# Patient Record
Sex: Male | Born: 1965 | Race: White | Hispanic: No | Marital: Single | Smoking: Never smoker
Health system: Southern US, Community
[De-identification: ages and names within clinical notes are randomized; demographics above are authoritative.]

---

## 2018-07-15 ENCOUNTER — Other Ambulatory Visit: Payer: Self-pay

## 2018-07-15 ENCOUNTER — Emergency Department (HOSPITAL_COMMUNITY)
Admission: EM | Admit: 2018-07-15 | Discharge: 2018-07-15 | Disposition: A | Discharge status: Home / Self Care | Payer: 59 | Attending: Emergency Medicine | Admitting: Emergency Medicine

## 2018-07-15 ENCOUNTER — Emergency Department (HOSPITAL_COMMUNITY): Payer: 59

## 2018-07-15 ENCOUNTER — Encounter (HOSPITAL_COMMUNITY): Payer: Self-pay | Admitting: Emergency Medicine

## 2018-07-15 DIAGNOSIS — R079 Chest pain, unspecified: Secondary | ICD-10-CM

## 2018-07-15 DIAGNOSIS — Z7982 Long term (current) use of aspirin: Secondary | ICD-10-CM | POA: Diagnosis not present

## 2018-07-15 DIAGNOSIS — Z79899 Other long term (current) drug therapy: Secondary | ICD-10-CM | POA: Insufficient documentation

## 2018-07-15 LAB — CBG MONITORING, ED: Glucose-Capillary: 84 mg/dL (ref 70–99)

## 2018-07-15 LAB — CBC
HCT: 47 % (ref 39.0–52.0)
Hemoglobin: 15.3 g/dL (ref 13.0–17.0)
MCH: 29.5 pg (ref 26.0–34.0)
MCHC: 32.6 g/dL (ref 30.0–36.0)
MCV: 90.7 fL (ref 80.0–100.0)
Platelets: 269 10*3/uL (ref 150–400)
RBC: 5.18 MIL/uL (ref 4.22–5.81)
RDW: 12.4 % (ref 11.5–15.5)
WBC: 8.2 10*3/uL (ref 4.0–10.5)
nRBC: 0 % (ref 0.0–0.2)

## 2018-07-15 LAB — COMPREHENSIVE METABOLIC PANEL
ALT: 28 U/L (ref 0–44)
AST: 27 U/L (ref 15–41)
Albumin: 3.8 g/dL (ref 3.5–5.0)
Alkaline Phosphatase: 70 U/L (ref 38–126)
Anion gap: 14 (ref 5–15)
BUN: 10 mg/dL (ref 6–20)
CO2: 19 mmol/L — ABNORMAL LOW (ref 22–32)
Calcium: 9.6 mg/dL (ref 8.9–10.3)
Chloride: 103 mmol/L (ref 98–111)
Creatinine, Ser: 1.13 mg/dL (ref 0.61–1.24)
GFR calc Af Amer: >60 mL/min (ref >60)
GFR calc non Af Amer: >60 mL/min (ref >60)
Glucose, Bld: 128 mg/dL — ABNORMAL HIGH (ref 70–99)
Potassium: 3.9 mmol/L (ref 3.5–5.1)
Sodium: 136 mmol/L (ref 135–145)
Total Bilirubin: 0.7 mg/dL (ref 0.3–1.2)
Total Protein: 7.4 g/dL (ref 6.5–8.1)

## 2018-07-15 LAB — TROPONIN I

## 2018-07-15 MED ORDER — ASPIRIN 81 MG PO CHEW: 324.0000 mg | CHEWABLE_TABLET | Freq: Once | ORAL | Status: DC

## 2018-07-15 MED ORDER — ASPIRIN EC 325 MG PO TBEC: 325.0000 mg | DELAYED_RELEASE_TABLET | Freq: Every day | ORAL | 0 refills | Status: AC

## 2018-07-15 NOTE — ED Notes (Signed)
Patient transported to X-ray 

## 2018-07-15 NOTE — ED Triage Notes (Signed)
Pt here from home with c/o chest pain times 2 days worse with movement and palpation and deep breathing  , pt had 324 mg asa , no nitro

## 2018-07-15 NOTE — ED Provider Notes (Signed)
[facility] EMERGENCY DEPARTMENT Provider Note   CSN: [id number] Arrival date & time: 07/15/18  0825     History   Chief Complaint Chief Complaint  Patient presents with  . Chest Pain    HPI Peter Dixon is a 53 y.o. male.  The history is provided by the patient.  Chest Pain  Pain location:  Substernal area Pain quality: sharp and shooting   Pain radiates to:  Does not radiate Pain severity:  No pain Duration:  2 hours Timing:  Constant Progression:  Improving Chronicity:  New Relieved by:  None tried   History reviewed. No pertinent past medical history.  There are no active problems to display for this patient.   History reviewed. No pertinent surgical history.      Home Medications    Prior to Admission medications   Medication Sig Start Date End Date Taking? Authorizing Provider  calcium carbonate (TUMS EX) 750 MG chewable tablet Chew 2 tablets by mouth as needed for heartburn.   Yes [provider]  DM-Doxylamine-Acetaminophen (NYQUIL HBP COLD & FLU) 15-6.25-325 MG/15ML LIQD Take 15 mLs by mouth as needed (cold symptoms).   Yes [provider]  esomeprazole (NEXIUM) 20 MG capsule Take 20 mg by mouth daily before breakfast.   Yes [provider]  ibuprofen (ADVIL,MOTRIN) 200 MG tablet Take 400 mg by mouth every 6 (six) hours as needed for moderate pain.   Yes [provider]  meloxicam (MOBIC) 7.5 MG tablet Take 7.5 mg by mouth daily. For 30 days started 12-.18-19 06/25/18 07/25/18 Yes [provider]  sildenafil (REVATIO) 20 MG tablet Take 20-100 mg by mouth daily as needed. For ED 06/25/18  Yes [provider]  aspirin EC 325 MG tablet Take 1 tablet (325 mg total) by mouth daily. 07/15/18   [place] Mallon, Barbara Cower, MD    Family History History reviewed. No pertinent family history.  Social History Social History   Tobacco Use  . Smoking status: Never Smoker  . Smokeless tobacco: Never Used    Substance Use Topics  . Alcohol use: Never    Frequency: Never  . Drug use: Not on file     Allergies   Patient has no known allergies.   Review of Systems Review of Systems  Cardiovascular: Positive for chest pain.  All other systems reviewed and are negative.    Physical Exam Updated Vital Signs BP 116/90   Pulse 70   Temp 98.1 F (36.7 C)   Resp 16   SpO2 95%   Physical Exam Vitals signs and nursing note reviewed. Exam conducted with a chaperone present.  Constitutional:      Appearance: He is well-developed.  HENT:     Head: Normocephalic and atraumatic.     Nose: Nose normal.     Mouth/Throat:     Mouth: Mucous membranes are dry.     Pharynx: Oropharynx is clear.  Eyes:     Extraocular Movements: Extraocular movements intact.     Conjunctiva/sclera: Conjunctivae normal.     Pupils: Pupils are equal, round, and reactive to light.  Neck:     Musculoskeletal: Normal range of motion.  Cardiovascular:     Rate and Rhythm: Normal rate.  Pulmonary:     Effort: Pulmonary effort is normal. No respiratory distress.  Chest:     Chest wall: Tenderness present.  Abdominal:     General: There is no distension.  Musculoskeletal: Normal range of motion.  Neurological:  General: No focal deficit present.     Mental Status: He is alert.      ED Treatments / Results  Labs (all labs ordered are listed, but only abnormal results are displayed) Labs Reviewed  COMPREHENSIVE METABOLIC PANEL - Abnormal; Notable for the following components:      Result Value   CO2 19 (*)    Glucose, Bld 128 (*)    All other components within normal limits  TROPONIN I  CBC  TROPONIN I  CBG MONITORING, ED    EKG EKG Interpretation  Date/Time:  Tuesday July 15 2018 08:37:15 EST Ventricular Rate:  76 PR Interval:    QRS Duration: 93 QT Interval:  376 QTC Calculation: 423 R Axis:   46 Text Interpretation:  Sinus rhythm No old tracing to compare Confirmed by Marily Memos [phone]) on 07/15/2018 10:33:52 AM   Radiology Dg Chest 2 View  Result Date: 07/15/2018 CLINICAL DATA:  Chest pain today EXAM: CHEST - 2 VIEW COMPARISON:  None. FINDINGS: No active infiltrate or effusion is seen. Mediastinal and hilar contours are unremarkable. The heart is within normal limits in size. No bony abnormality is seen. IMPRESSION: No active cardiopulmonary disease. Electronically Signed   By: Dwyane Dee M.D.   On: 07/15/2018 09:23    Procedures Procedures (including critical care time)  Medications Ordered in ED Medications  aspirin chewable tablet 324 mg (324 mg Oral Not Given 07/15/18 0846)     Initial Impression / Assessment and Plan / ED Course  I have reviewed the triage vital signs and the nursing notes.  Pertinent labs & imaging results that were available during my care of the patient were reviewed by me and considered in my medical decision making (see chart for details).    Reproducible sharp chest TTP. No associated symptoms. Low risk of ACS and PE.   Wife arrival states that the pain is been off and on for the last years.  Has a cardiology appointment soon.  Delta troponin is negative.  Chest pain-free here.  Will suggest aspirin daily.  Final Clinical Impressions(s) / ED Diagnoses   Final diagnoses:  Nonspecific chest pain    ED Discharge Orders         Ordered    aspirin EC 325 MG tablet  Daily     07/15/18 1408           [place] Dalporto, Barbara Cower, MD 07/15/18 1437

## 2019-12-15 IMAGING — DX DG CHEST 2V
2 series · 2 of 2 positions shown · non-contrast
Comparison: None.

CLINICAL DATA: Chest pain today

EXAM:
CHEST - 2 VIEW

[x chest ap]
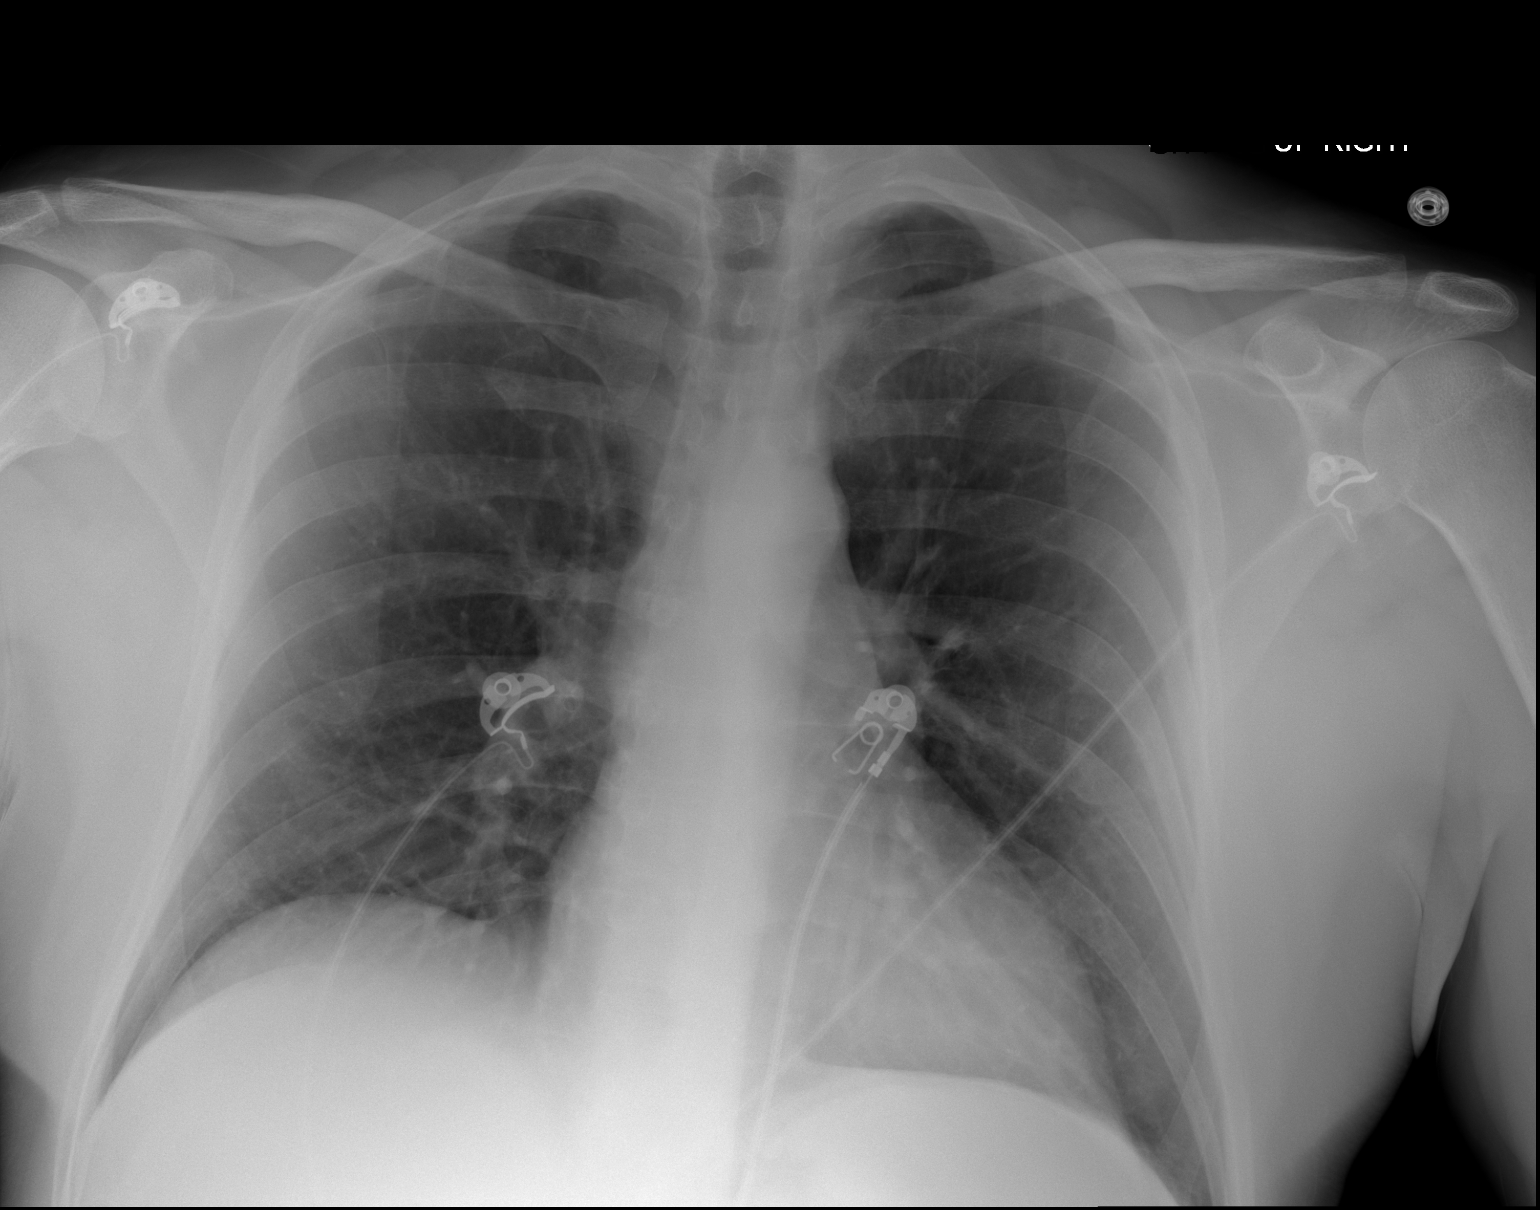

[w chest lat]
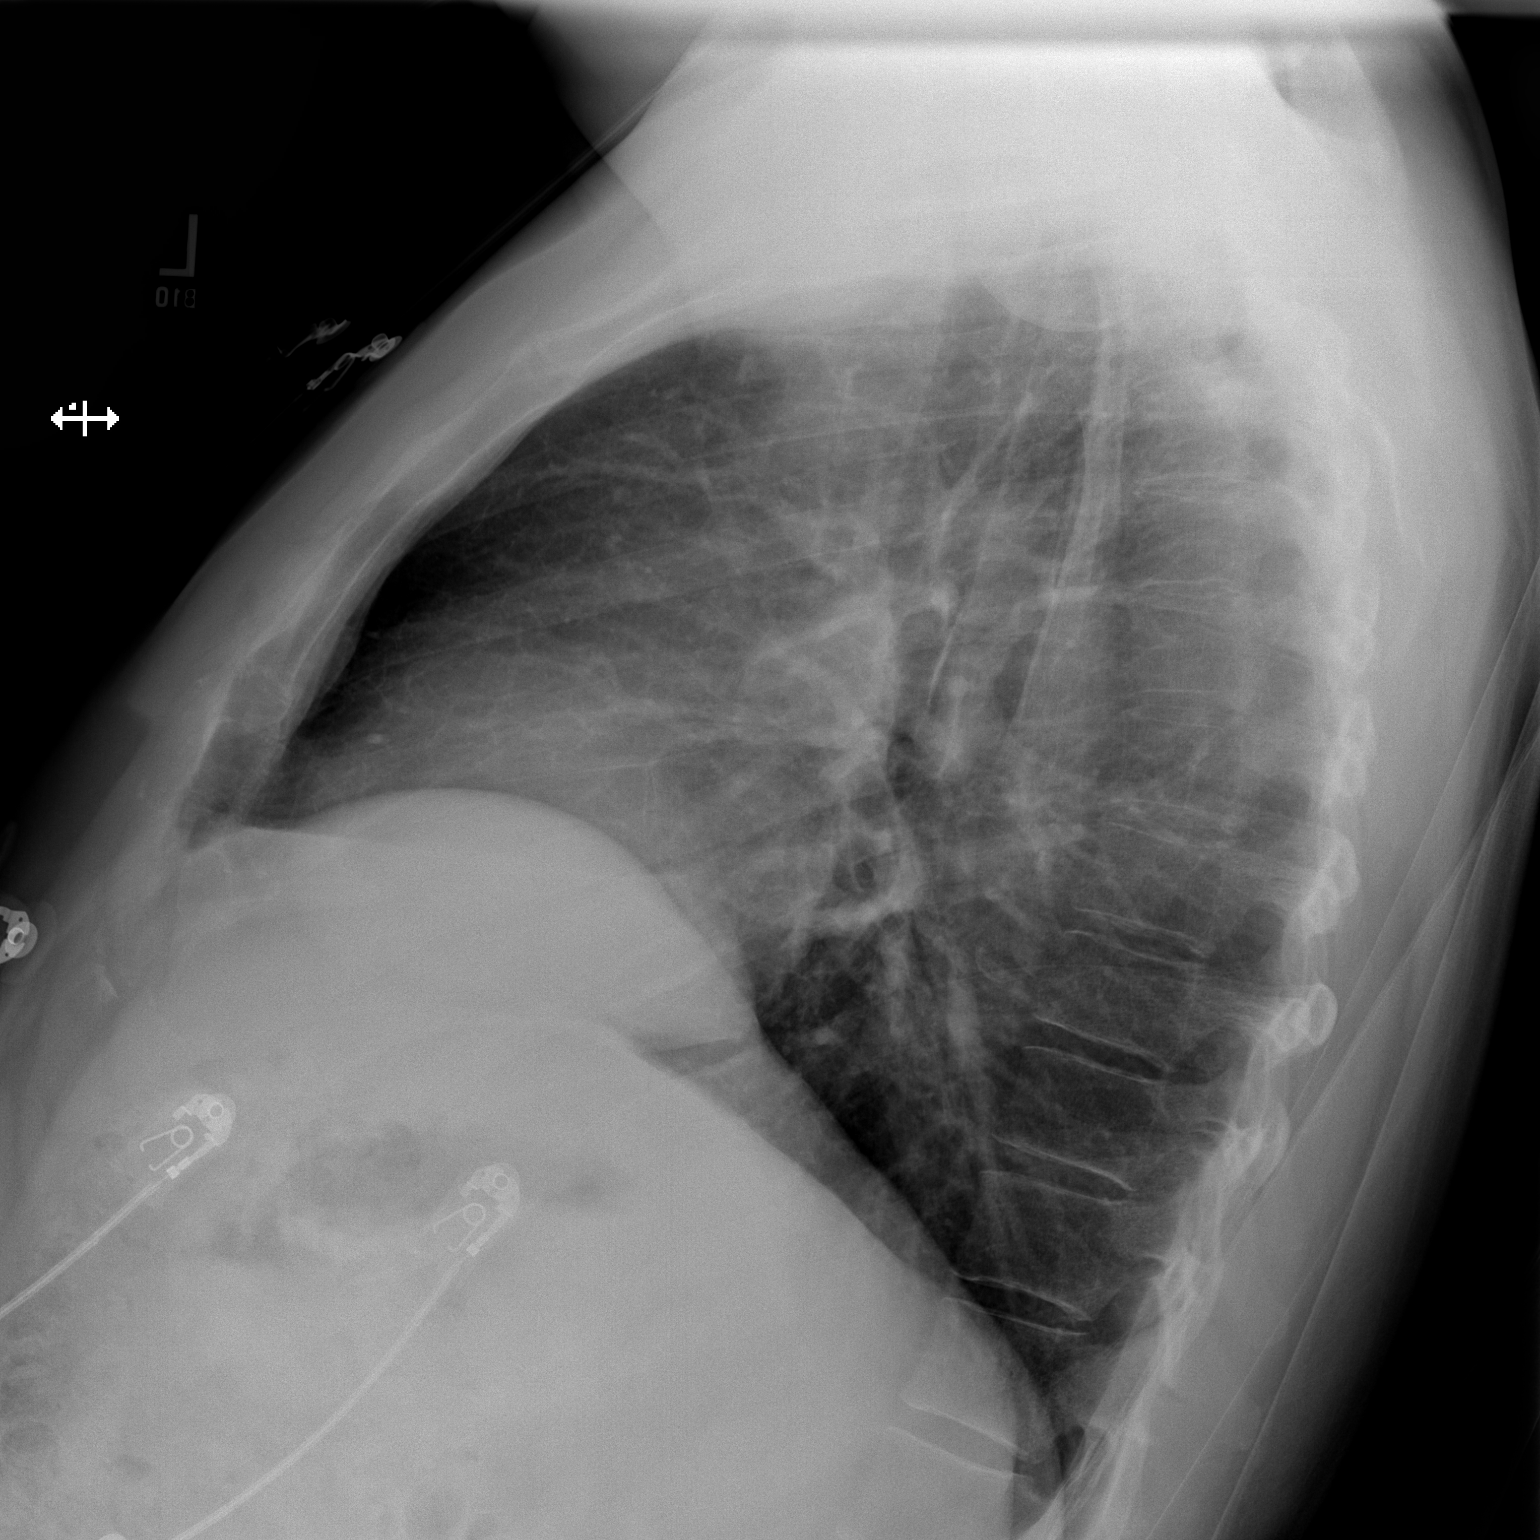

[2 of 2 positions shown; findings below may reference images not displayed]

FINDINGS: No active infiltrate or effusion is seen. Mediastinal and hilar
contours are unremarkable. The heart is within normal limits in
size. No bony abnormality is seen.
IMPRESSION: No active cardiopulmonary disease.
